# Patient Record
Sex: Female | Born: 1950 | Race: Black or African American | Hispanic: No | Marital: Single
Health system: Southern US, Community
[De-identification: ages and names within clinical notes are randomized; demographics above are authoritative.]

---

## 2007-03-05 ENCOUNTER — Ambulatory Visit: Payer: Self-pay | Admitting: Hospitalist

## 2007-03-05 ENCOUNTER — Inpatient Hospital Stay (HOSPITAL_COMMUNITY): Admission: EM | Admit: 2007-03-05 | Discharge: 2007-03-09 | Payer: Self-pay | Admitting: Emergency Medicine

## 2007-03-06 ENCOUNTER — Encounter (INDEPENDENT_AMBULATORY_CARE_PROVIDER_SITE_OTHER): Payer: Self-pay | Admitting: Hospitalist

## 2007-03-06 ENCOUNTER — Ambulatory Visit: Payer: Self-pay | Admitting: Vascular Surgery

## 2007-03-07 ENCOUNTER — Ambulatory Visit: Payer: Self-pay | Admitting: Physical Medicine & Rehabilitation

## 2007-03-09 ENCOUNTER — Ambulatory Visit: Payer: Self-pay | Admitting: Physical Medicine & Rehabilitation

## 2007-03-09 ENCOUNTER — Inpatient Hospital Stay (HOSPITAL_COMMUNITY)
Admission: RE | Admit: 2007-03-09 | Discharge: 2007-03-21 | Payer: Self-pay | Admitting: Physical Medicine & Rehabilitation

## 2007-04-04 ENCOUNTER — Encounter: Payer: Self-pay | Admitting: Licensed Clinical Social Worker

## 2007-04-04 ENCOUNTER — Ambulatory Visit: Payer: Self-pay | Admitting: Internal Medicine

## 2007-04-04 ENCOUNTER — Encounter (INDEPENDENT_AMBULATORY_CARE_PROVIDER_SITE_OTHER): Payer: Self-pay | Admitting: *Deleted

## 2007-04-04 DIAGNOSIS — F1010 Alcohol abuse, uncomplicated: Secondary | ICD-10-CM | POA: Insufficient documentation

## 2007-04-04 DIAGNOSIS — I10 Essential (primary) hypertension: Secondary | ICD-10-CM | POA: Insufficient documentation

## 2007-04-04 DIAGNOSIS — G8929 Other chronic pain: Secondary | ICD-10-CM

## 2007-04-10 LAB — CONVERTED CEMR LAB
CO2: 24 meq/L (ref 19–32)
Calcium: 9.7 mg/dL (ref 8.4–10.5)
Creatinine, Ser: 0.73 mg/dL (ref 0.40–1.20)
Glucose, Bld: 93 mg/dL (ref 70–99)
Sodium: 142 meq/L (ref 135–145)

## 2007-04-23 ENCOUNTER — Encounter
Admission: RE | Admit: 2007-04-23 | Discharge: 2007-04-23 | Payer: Self-pay | Admitting: Physical Medicine & Rehabilitation

## 2009-01-14 IMAGING — CT CT HEAD W/O CM
1 of 2 series · 13 of 30 positions shown, 17 images · IV contrast (agent unspecified)
Comparison: None.

CLINICAL DATA: Right-sided weakness. 
 HEAD CT WITHOUT CONTRAST:
TECHNIQUE: Contiguous axial images were obtained from the base of the skull through the vertex according to standard protocol without contrast.

[Series 2: brain · axial · 0.47mm/px · z∈[+142,+262]mm · 13 of 28 slices shown, 17 images]
[im 2/28  brain]
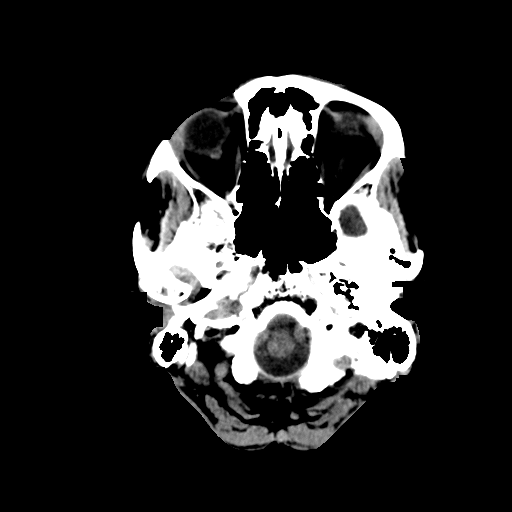
[im 2/28  bone]
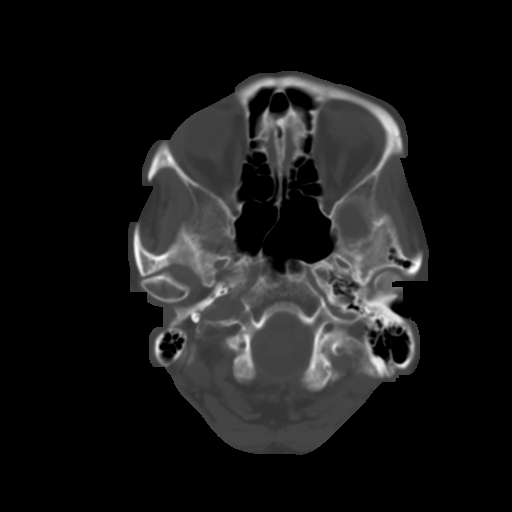
[im 4/28  brain]
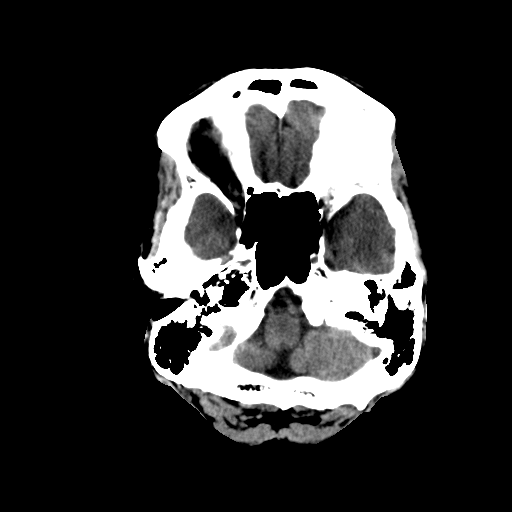
[im 6/28  brain]
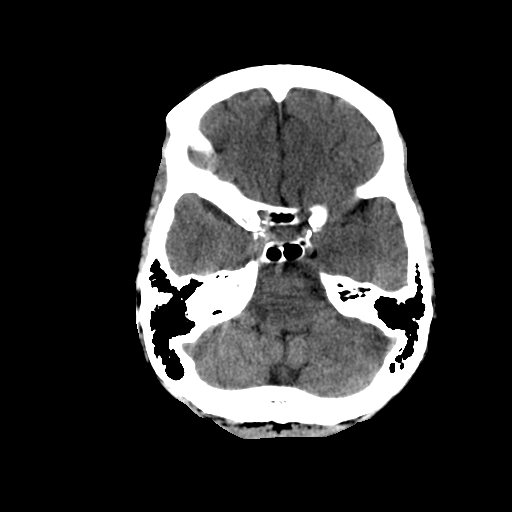
[im 8/28  brain]
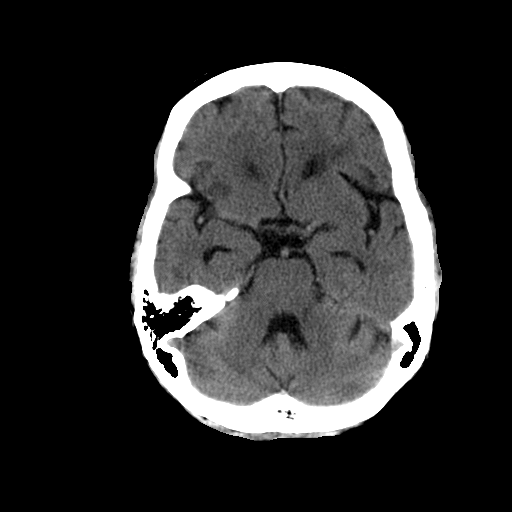
[im 10/28  brain]
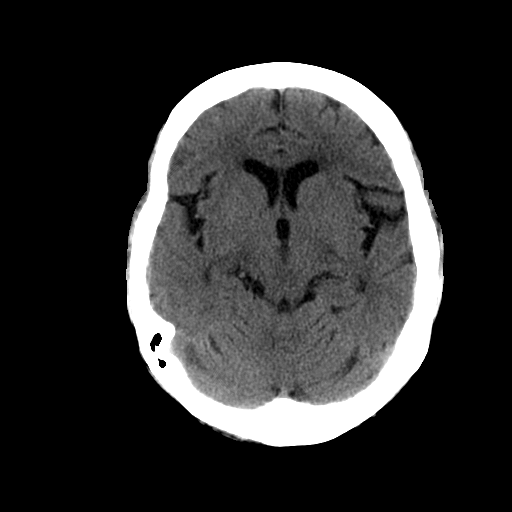
[im 10/28  bone]
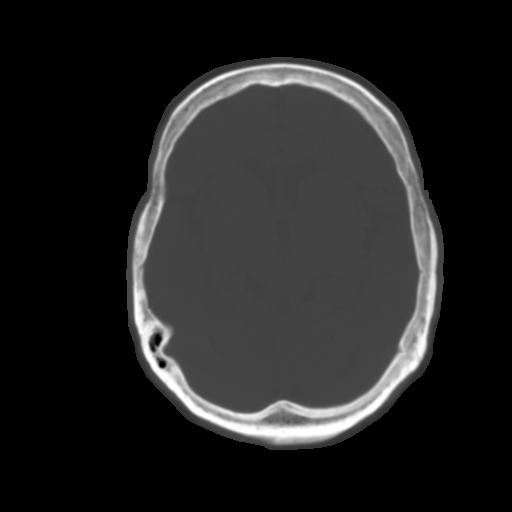
[im 12/28  brain]
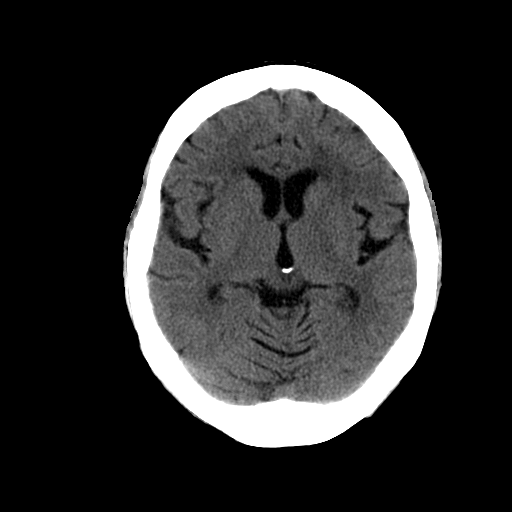
[im 14/28  brain]
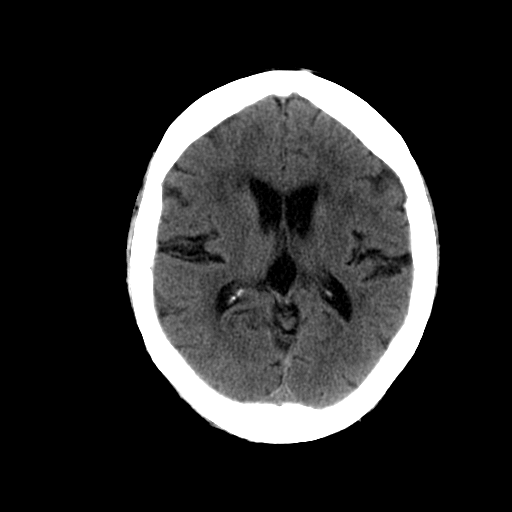
[im 16/28  brain]
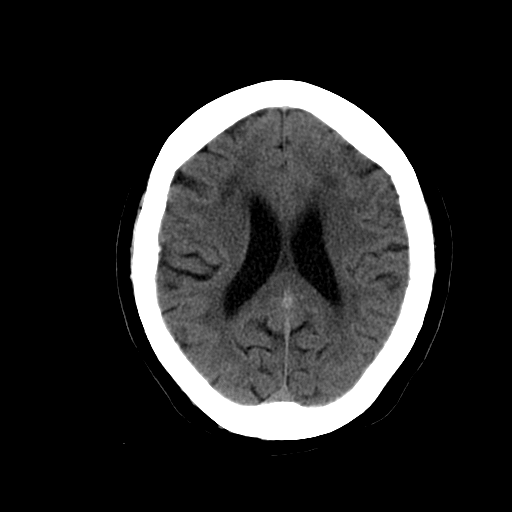
[im 18/28  brain]
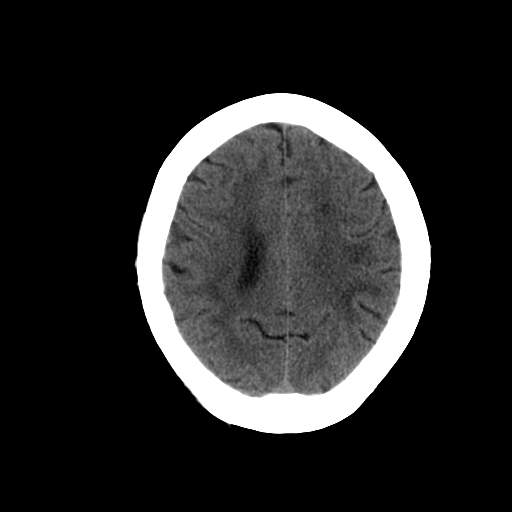
[im 18/28  bone]
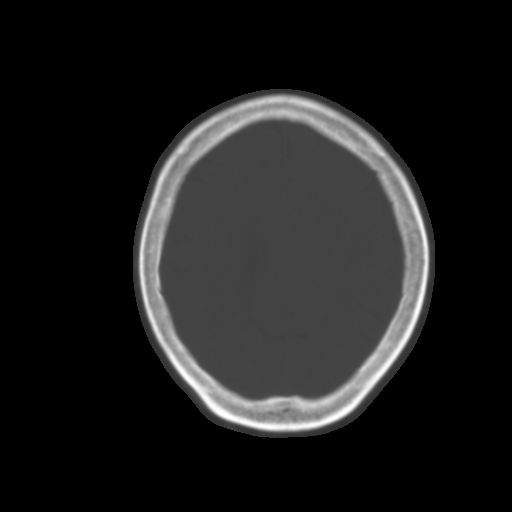
[im 20/28  brain]
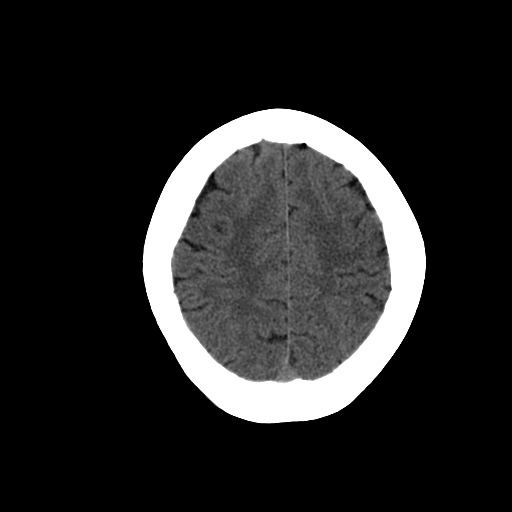
[im 22/28  brain]
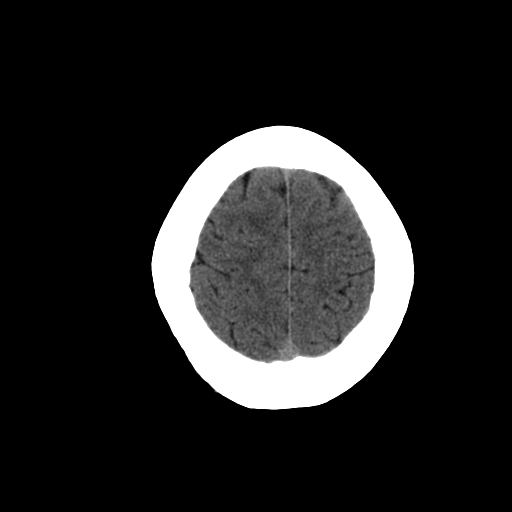
[im 24/28  brain]
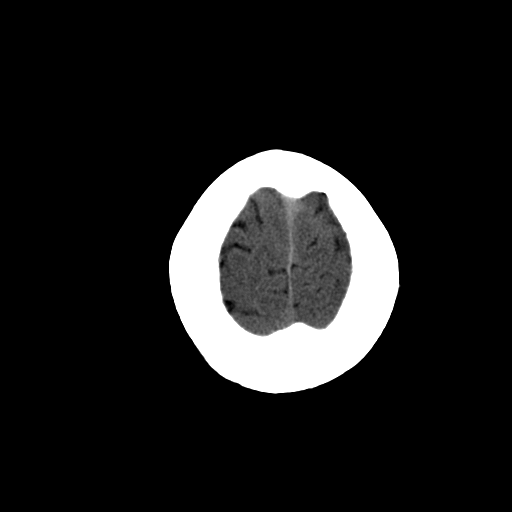
[im 26/28  brain]
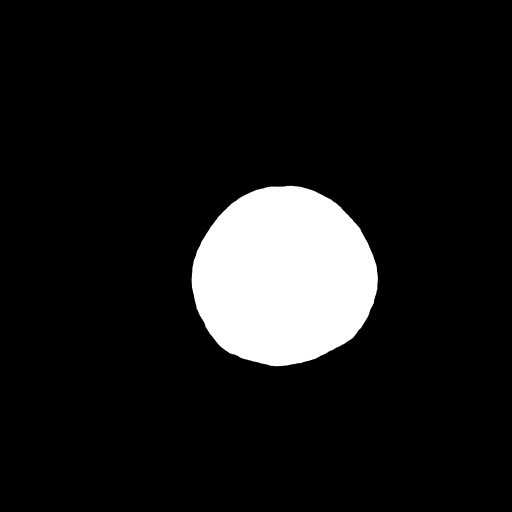
[im 26/28  bone]
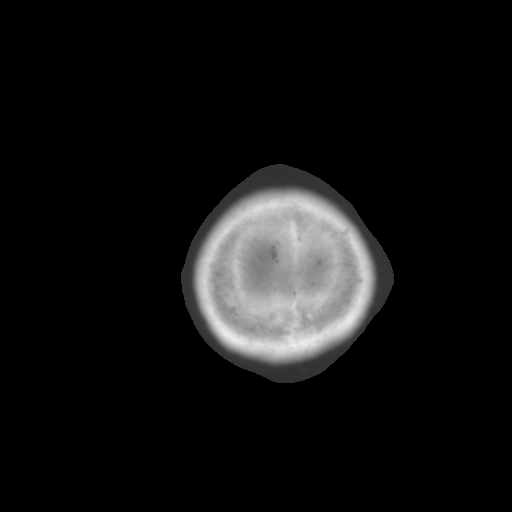

[13 of 30 positions shown; findings below may reference images not displayed]

FINDINGS: There is microvascular white matter disease.  There is no definitive acute infarct.  There is, however, a probable infarct in the posterior limb of the left internal capsule.  No hemorrhage. No mass effect. 
 Note that acute ischemic events may be occult on CT in the 1st 12-24 hours.  This scan should be repeated, or MRI should be done, if symptoms continue to evolve.
IMPRESSION: 1.  Chronic microvascular white matter disease.  
 2.  Probable infarct in the posterior limb of the left internal capsule. 
 3.  No definite acute infarct or bleed.  See note above.

## 2009-01-16 IMAGING — CR DG SHOULDER 2+V*R*
3 series · 3 of 3 positions shown · non-contrast
Comparison: none

CLINICAL DATA: Fell with proximal humeral pain. 
 RIGHT SHOULDER - 3 VIEW:

[t shoulder ap internal righ]
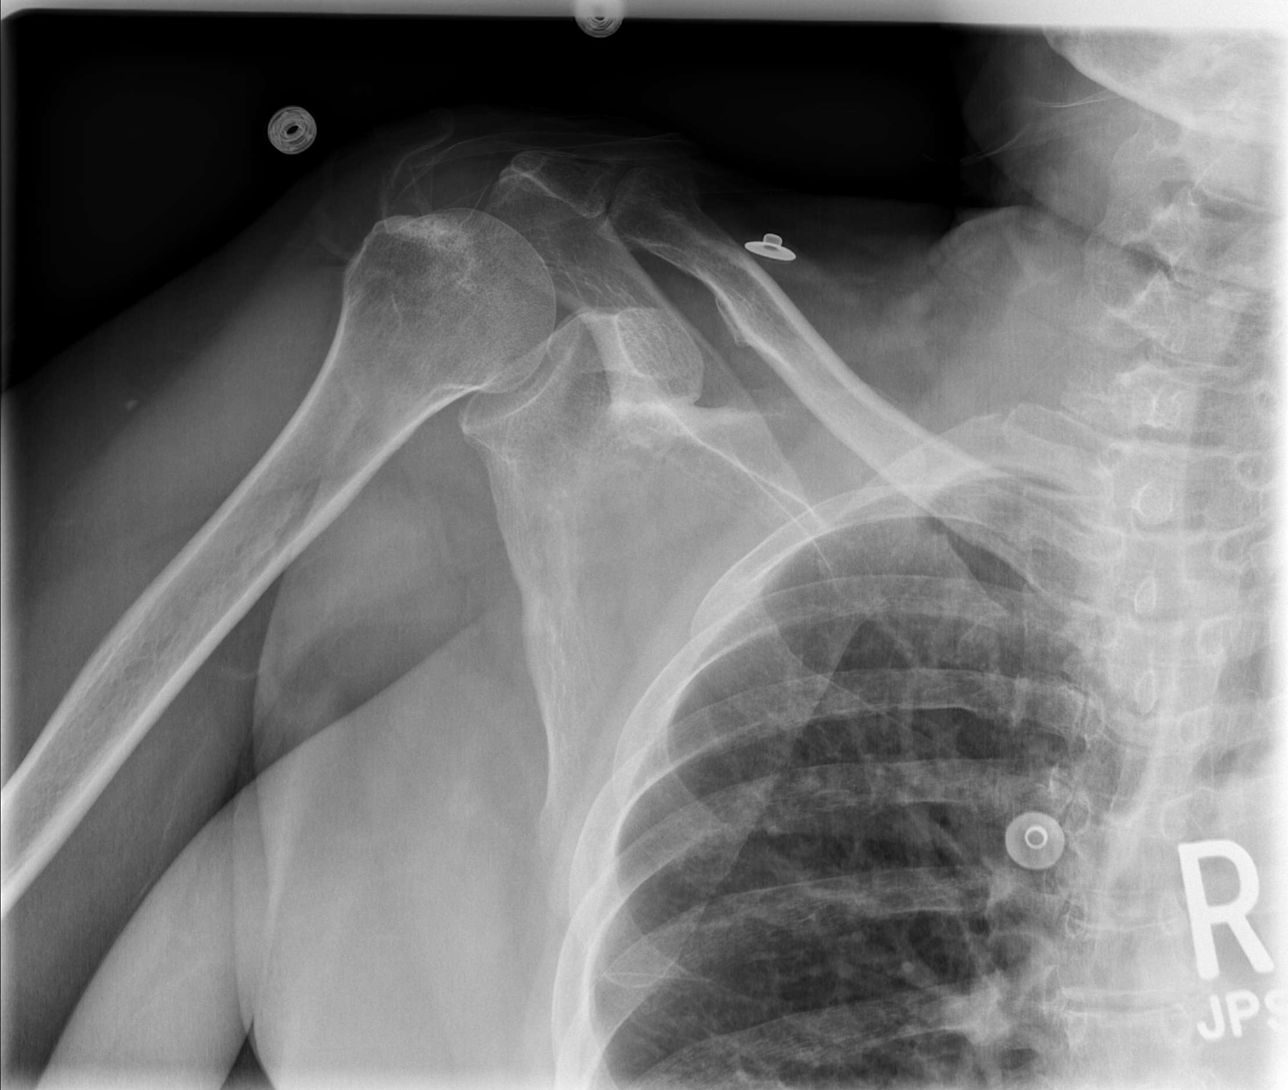

[t shoulder ap external righ]
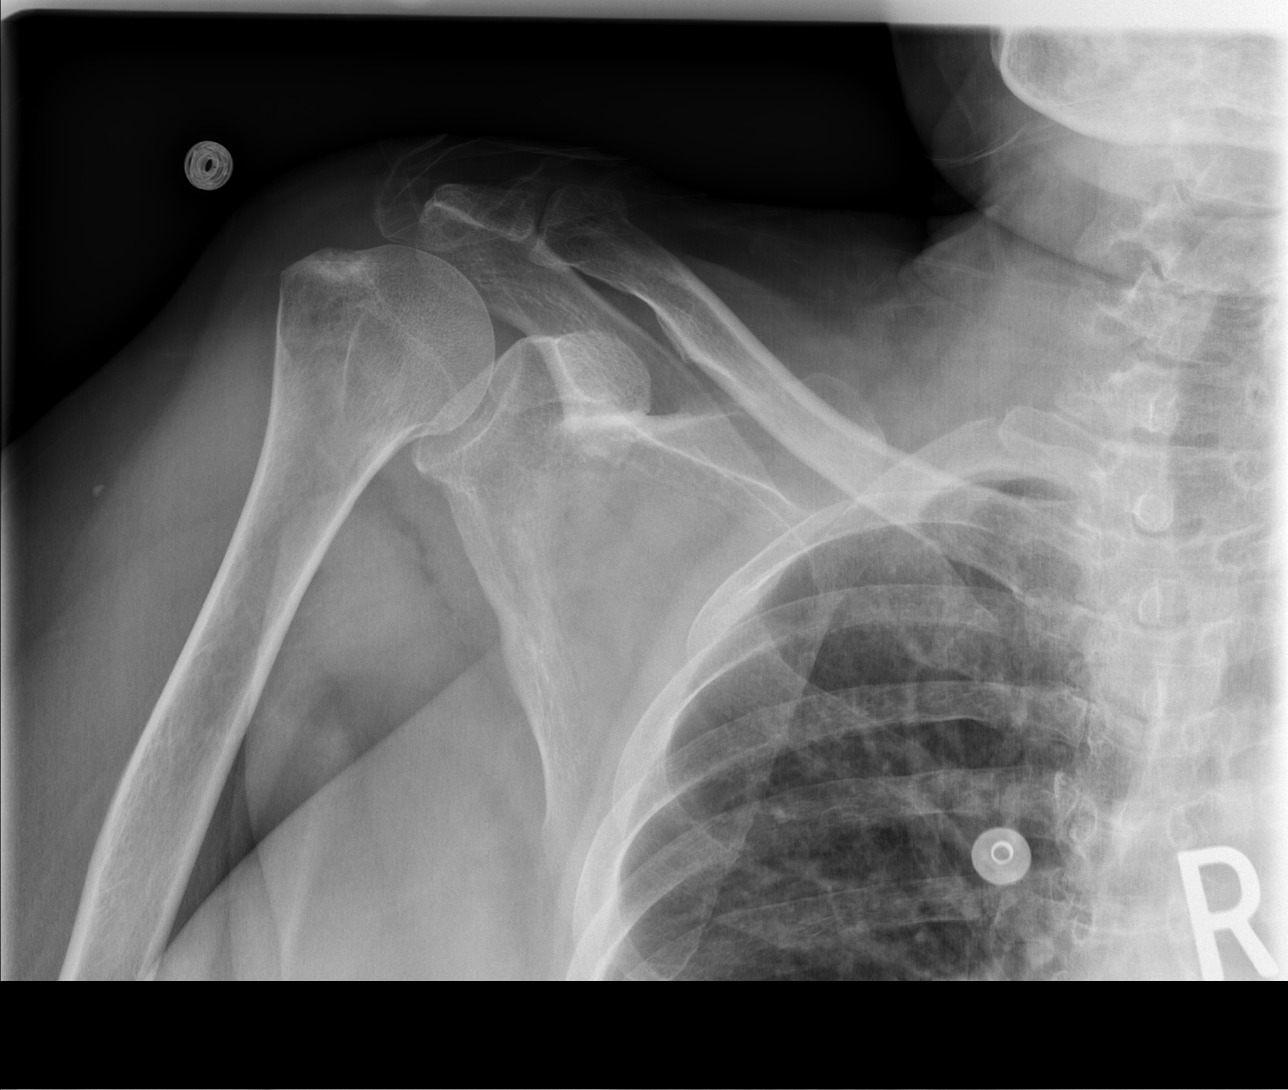

[t shoulder y view right]
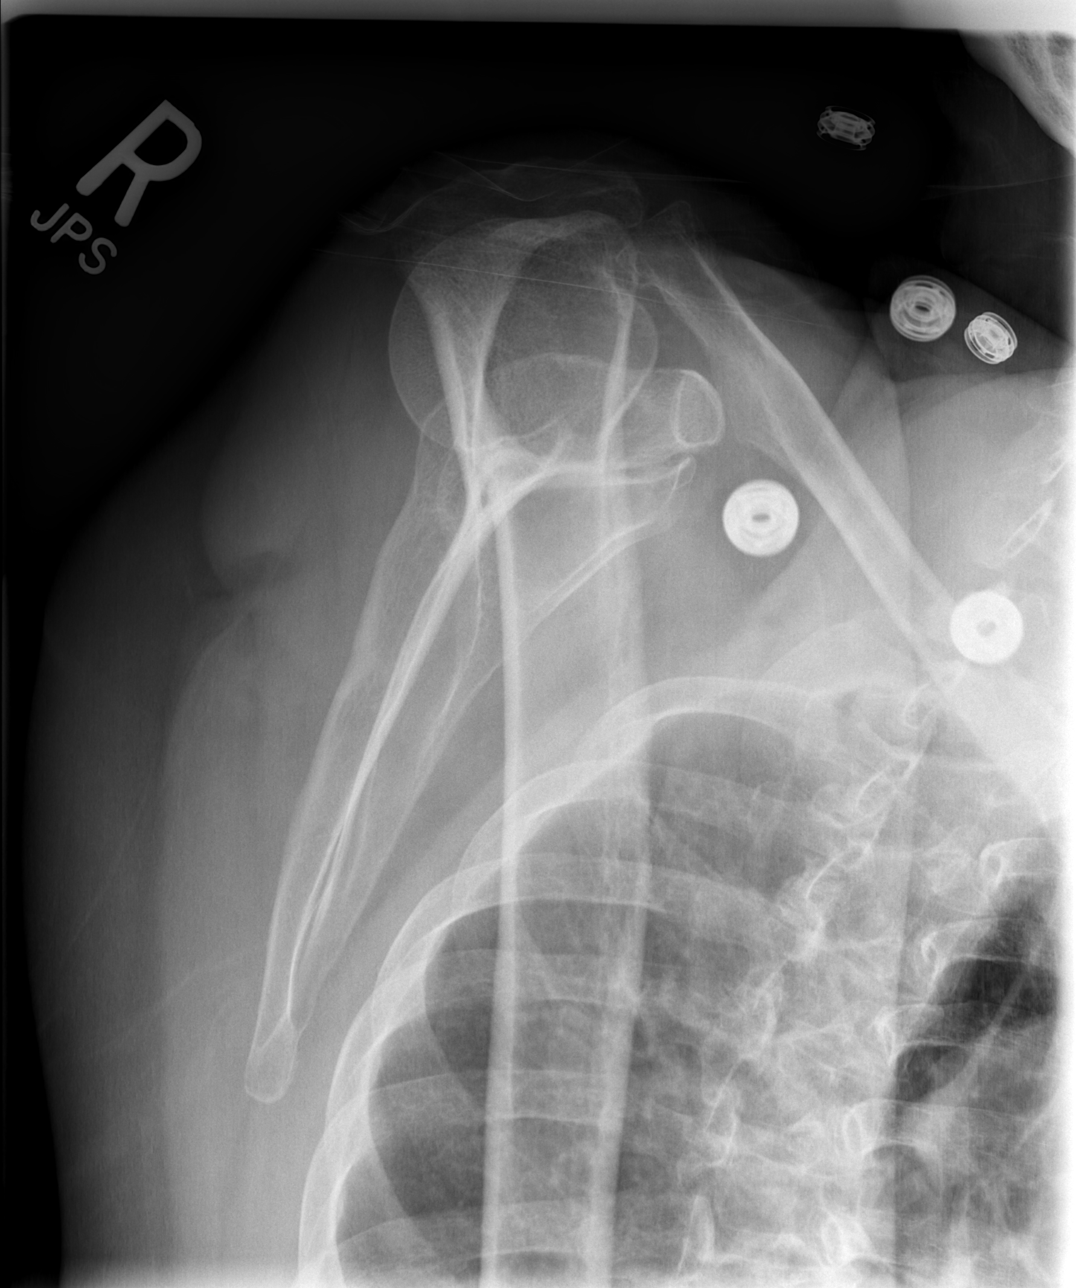

[3 of 3 positions shown; findings below may reference images not displayed]

FINDINGS: Three views of the right humerus were obtained. No acute fracture is seen.  The glenohumeral joint space appears normal. The AC joint is normally aligned.
IMPRESSION: Negative.

## 2010-07-01 ENCOUNTER — Ambulatory Visit: Payer: Self-pay | Admitting: Internal Medicine

## 2010-11-30 NOTE — H&P (Signed)
NAME:  Loretta Barajas, Loretta Barajas NO.:  [id number]   MEDICAL RECORD NO.:  [id number]          PATIENT TYPE:  IPS   LOCATION:  4009                         FACILITY:  [facility]   PHYSICIAN:  Ellwood Dense, M.D.   DATE OF BIRTH:  Feb 21, 1951   DATE OF ADMISSION:  03/09/2007  DATE OF DISCHARGE:                              HISTORY & PHYSICAL   PRIMARY CARE PHYSICIAN:  None.   HISTORY OF PRESENT ILLNESS:  Loretta Barajas is a 60 year old African-  American female with history of hypertension for which she took  hydrochlorothiazide at home.  The patient was admitted March 05, 2007,  with complaints of dizziness and right-sided weakness.  MRI studies  showed an acute ischemic infarct in the posterior limb of the left  internal capsule with extension into the lateral aspect of the left  thalamic region.  MRA study was suggestive of stenosis of the right  vertebrobasilar junction proximally and distally to the right pica where  there was narrowing of approximately 50%.  Echocardiogram showed an  ejection fraction of 55% to 65%.  Carotid Dopplers were negative for  internal carotid artery stenosis.  The patient was placed on aspirin for  stroke prophylaxis along with subcu Lovenox.  Her blood pressure control  was maintained on hydrochlorothiazide.   The patient was evaluated by the rehabilitation physicians and felt to  be an appropriate candidate for inpatient rehabilitation.   REVIEW OF SYSTEMS:  Noncontributory.   PAST MEDICAL HISTORY:  1. Hypertension.  2. Chronic low back pain.   FAMILY HISTORY:  Noncontributory.   SOCIAL HISTORY:  The patient lives with her daughter and son-in-law and  their 3 children in a home.  The patient's daughter has 6 other children  who are apparently outside the home.  The patient herself is on  Disability since 1993.  The daughter and son-in-law whom she lives with  work different shifts, according to the patient, but then she reports  that her  daughter may have recently lost her job.  The patient does not  use tobacco but admits to drinking 6-7 beers per day.  The patient's  daughter can assist as needed.  The home is a 1-level home with 1 step  to enter.  The grandchildren can also reportedly help the patient at  home.   FUNCTIONAL HISTORY PRIOR TO ADMISSION:  Independent but not driving.   ALLERGIES:  No known drug allergies.   MEDICATIONS PRIOR TO ADMISSION:  Hydrochlorothiazide 25 mg daily.   LABORATORY DATA:  Recent hemoglobin was 13.6 with hematocrit of 40.2,  platelet count of 263,000 and white count of 7.  Recent sodium was 142,  potassium 3.9, chloride 104, CO2 30, BUN 15 and creatinine 0.7.  Urine  drug screen was negative on admission.   PHYSICAL EXAMINATION:  GENERAL:  Reasonably well-appearing middle-aged  adult female lying in bed in no acute discomfort.  VITAL SIGNS:  Blood pressure 130/84 with pulse of 80, respiratory rate  20 and temperature 98.  HEENT:  Normocephalic, atraumatic.  CARDIOVASCULAR:  Regular rate and rhythm.  S1 and S2 without murmurs.  ABDOMEN:  Soft, nontender with positive bowel sounds.  NEUROLOGIC:  Alert and oriented x3 with occasional cues.  Cranial nerve  exam showed tongue deviating to the right with protrusion.  Facial  symmetry was intact and there was no reported sensation loss.  Extraocular muscles were intact with no reports of vision changes.  EXTREMITIES:  Left upper and left lower extremity exam showed 5-/5  strength throughout.  Bulk and tone were normal.  Reflexes were 2+ and  symmetrical.  Right upper extremity exam showed 2+/5 strength in  proximal right upper extremity and 0/5 in elbow flexion, elbow  extension, wrist extension and intrinsics of the right hand.  The right  lower extremity exam showed hip flexion at 3-/5 with knee extension of  3/5.  Sensation was intact to light touch throughout the bilateral lower  extremities.  Ankle dorsiflexion was 2/5.    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   Presently the patient has deficits in ADLs, transfers and ambulation  along with higher level cognition related to the above-noted left  internal capsule infarct.   PLAN:  1. Admit to the rehabilitation unit for daily therapies to include      physical therapy for range of motion, strengthening, bed mobility,      transfers, pre-gait training, gait training and equipment.  2. Occupational Therapy for range of motion, strengthening, ADLs,      cognitive/perceptual training, splinting and equipment eval.  3. Rehab nursing for skin care, wound care and bowel and bladder      training as necessary.  4. Speech Therapy for higher level cognition along with evaluation of      swallow as necessary.  5. Case management to assess home environment, assist with discharge      planning and arrange for appropriate followup care.  6. Social worker to assess family and social support and assist in      discharge planning.  7. Subcu Lovenox 40 mg daily for DVT prophylaxis.  8. CBC with diff and CMET in a.m. March 12, 2007.  9. Protonix 40 mg p.o. daily.  10.Folic acid 1 mg p.o. daily.  11.Thiamine 100 mcg p.o. daily.  12.Aspirin 81 mg p.o. daily.  13.Hydrochlorothiazide 25 mg p.o. daily.  14.Old EKG to chart.  15.Routine turning to prevent skin breakdown.  16.Dulcolax suppository 1 per rectum daily p.r.n.  17.Regular diet.  18.Tylenol 325 mg 1-2 tablets p.o. q.4 h p.r.n. for pain relief.   PROGNOSIS:  Good.   ESTIMATED LENGTH OF STAY:  Eight to 15 days.   GOALS:  Modified independent to stand-by assist for upper extremity ADLs  and min assist for lower extremities ADLs with modified independent  wheelchair mobility and transfers and min assist for ambulation.           ______________________________  Ellwood Dense, M.D.     DC/MEDQ  D:  03/09/2007  T:  03/09/2007  Job:  [id number]

## 2010-11-30 NOTE — Discharge Summary (Signed)
NAME:  Loretta Barajas, Loretta Barajas NO.:  [id number]   MEDICAL RECORD NO.:  [id number]          PATIENT TYPE:  INP   LOCATION:  5152                         FACILITY:  [facility]   PHYSICIAN:  Tacey Ruiz, MD    DATE OF BIRTH:  1950-08-02   DATE OF ADMISSION:  03/05/2007  DATE OF DISCHARGE:  03/09/2007                               DISCHARGE SUMMARY   CHIEF COMPLAINT:  Right-sided weakness.   DISCHARGE DIAGNOSES:  1. Left internal capsule infarct.  2. Hypertension.  3. Alcohol abuse.   DISCHARGE MEDICATIONS:  1. Aspirin 81 mg daily.  2. Folic acid 1 mg daily.  3. Hydrochlorothiazide 25 mg daily.  4. Thiamin 100 mg daily.   DISPOSITION AND FOLLOW UP:  Patient did not have a primary care doctor  prior to her admission.  Patient recently from [place] to [place]  and does not have a primary care doctor in the area.  She is more than  welcome to follow up with one of the doctors here at the outpatient  clinic, [phone].  Patient can call and make an appointment at the time  of her discharge from inpatient rehab.  Patient was transferred from  inpatient status to the inpatient rehab facility here at [facility], where she will undergo rehab therapy after she underwent a  left internal capsule infarct and was left with right-sided hemiparesis.  While in the inpatient rehab, they can continue to monitor the patient's  blood pressure, but this has been under good control on her current home  regimen.   PROCEDURES:  1. CT head, March 05, 2007.  Impression - chronic microvascular white      matter disease, probable infarct posterior limb of left internal      capsule.  No definite acute infarct or bleed.  2. MRI brain, March 06, 2007.  Impression - acute ischemic infarct      noted in the region of the posterior limb of the left internal      capsule extending into the lateral aspect of the left thalamic      region.  Nonspecific white matter changes, likely  represent areas      of ischemic gliosis related to hypertension or diabetes.  3. MRA neck, March 06, 2007.  Impression - no gross evidence of      occlusion, stenosis, dissection or aneurysm.  4. Right shoulder x-ray, March 07, 2007.  Impression - no acute      fracture.  Joint space appears normal.  5. Transthoracic echocardiogram, March 06, 2007, which showed no      likely intracardiac source of emboli was apparent.  Normal left      ventricular systolic function and ejection fraction of      approximately 65%.  6. Transcarotid Dopplers, March 06, 2007, which showed bilateral      tortuous ICAs, but without significant stenosis bilaterally.      Vertebral artery flow was antegrade bilaterally.   CULTURES:  None.   CONSULTATIONS:  Inpatient rehabilitation March 06, 2007.   BRIEF HISTORY AND PHYSICAL:  Ms. Zupko is a 59 year old  African  American female with a past medical history significant for only  hypertension.  Patient was initially helping her daughter move 2 days  prior to admission when she got up out of the truck and felt dizzy.  Patient says she felt drunk.  At that time, she had full movement of all  of her extremities.  Patient reports that dizziness and blurry vision  lasted for approximately 20 minutes then went away completely.  Patient  said she felt normal after that.  That night, patient went to bed and  awoke approximately at 3 a.m. to get up and go to the bathroom.  Patient, however, could not get up secondary to her right leg not  moving.  She was also unable to move her right arm.  Patient went back  to sleep and hoped the numbness would go away.  That morning, it still  had not gone away.  Patient waited for her daughter to get home from  work around 3 p.m. and came into the hospital.  Patient has had no prior  episodes of numbness or weakness in her extremities.  Patient has a  history of CVAs in both her mother and grandmother.  Patient denies a   history of hyperlipidemia, diabetes, any illicit drug use or cocaine  use.  She does report current problems making her words, but has no  problem finding her words.  Patient denies any vision changes, any  current numbness or any dizziness.  Patient has been out on disability  since 1993 secondary to unknown reasons.  The only medication patient  was on prior to admission was hydrochlorothiazide.  She only recently  moved to [facility] within the past month and was living with her  daughter.   REVIEW OF SYMPTOMS:  Negative.   PHYSICAL EXAMINATION:  VITAL SIGNS:  Temperature 98.4, blood pressure  124/91, pulse 108, respiratory rate 18, saturations 99% on room air.  GENERAL:  In no acute distress.  Alert and oriented x3.  Right  hemiparesis.  HEENT:  Pupils equal, reactive to light and accommodation.  Extraocular  movements intact.  Anicteric.  Mucous membranes moist.  Poor dentition.  NECK:  Supple.  No LAD.  No bruit.  RESPIRATORY:  Clear to auscultation bilaterally.  CARDIOVASCULAR:  Regular rate and rhythm.  No murmurs, rubs or gallops.  GI:  Soft, nontender, no distention.  Positive bowel sounds.  Obese.  EXTREMITIES:  No clubbing, cyanosis or edema.  Good peripheral pulses.  SKIN:  No rashes.  NEURO:  Alert and oriented x4.  Cranial nerves II-XII intact, but with a  slight right-sided facial droop at the mouth.  Complete right upper  extremity and right lower extremity paralysis.  Patient has sensation  intact bilaterally upper and lower extremities to fine touch.  DTRs were  2+ and symmetric.  Patient had upgoing Babinski sign on the right.   LABORATORY DATA:  White count 7.6, hemoglobin 13.7, platelets 262.  Patient had a PT of 13.5, PTT 26, INR 1.0.  Patient had a CMET with  sodium 141, potassium 3.7, chloride 105, bicarb 28, BUN 12, creatinine  0.86, and glucose 92.  AST 20, ALT 12, protein 7.2, albumin 3.6, calcium  9.1.  CT of her head showed a left internal capsule  infarct.   HOSPITAL COURSE:  1. Right-sided hemiparesis.  Patient presented with right-sided      hemiparesis approximately 17 hours after her initial symptom, which      was dizziness.  Therefore, she was not a candidate for TPA.      Patient had a CT of the head done, which showed a left internal      capsule infarct of indeterminate age.  A MRI was repeated which      showed an acute left internal capsule infarct that extended into      the thalamic region.  Since TPA was not an option, there was no      medical treatment we could do for this patient, other than observe.      We also worked the patient up to try to identify risk factors that      would lead to a cardiovascular event.  Patient had a transthoracic      echocardiogram which was normal.  Patient had a hemoglobin A1c,      which was normal at 5.6.  Patient had a fasting lipid profile,      which was also normal with a total cholesterol of 167 and a LDL of      97.  Patient had a TSH level checked, which was normal at 2.783.      Patient had a urine drug screen, which was negative for cocaine.      Patient had normal transcarotid Dopplers.  Therefore, the only risk      factor we could identify in this patient was a history of      hypertension and a family history of cerebrovascular events.      Patient's hemiparesis improved slightly over the course of her      hospitalization.  Initially, patient had no movement at all in her      right upper extremity and right lower extremity.  However, patient      began to get some movement back in her shoulder and slight movement      in her right elbow.  Patient was also able to move her right hip      and lift her leg off the bed at the time of discharge.  Patient was      discharged to inpatient rehab with hopes that intensive      rehabilitation therapy will improve her mobility.  2. Hypertension.  Patient has a history of hypertension and when      admitted to the hospital,  stated she was on hydrochlorothiazide at      unknown dose.  Patient did not have a PCP in [place] and said      that she had only started seeing a doctor in the past month, who      had given her a prescription for this medication.  Patient reports      to seeing multiple doctors in the past, but having no continuity      with any single doctor.  Her systolic pressure in the ED was 124.      We initially started the patient back on her blood pressure, but we      were advised to hold the blood pressure medicine 72 hours post      ischemic event.  We then held the hydrochlorothiazide and restarted      it 72 hours later.  Patient's blood pressure during her      hospitalization got up to as high 190, but after      hydrochlorothiazide was restarted, systolic blood pressure remained      in the 130-140s.  3. Patient has a history of alcohol abuse and  was started on thiamin      and folic acid.  Patient was also started on CIWA protocol, but did      not require any Ativan for withdrawal symptoms.  Patient      acknowledges using alcohol on both weekend days every week and      drinks greater than five 40s on each day.  4. Right shoulder pain.  On the second day of hospitalization, patient      acknowledged having right shoulder pain.  Patient then stated that      she had a fall in her bathroom while trying to ambulate one day      prior to admission.  Secondary to this fall, we got a right      shoulder x-ray which was negative for any kind of fracture.  She      had a normal joint space.  It was felt likely that this shoulder      pain was secondary to overuse and as this joint was the only one      which the patient could actively move secondary to her right-sided      paralysis.  The shoulder pain improved over the course of      hospitalization.  Patient did not require any pain medications.  5. Hypokalemia.  On March 07, 2007, patient's potassium dropped to      3.4.  This was  repleted orally.  This problem then resolved.  A      magnesium level was also checked and was low at 1.6.  6. Hypomagnesemia.  Patient's magnesium level was checked secondary to      hypokalemia and was lot at 1.6.  This was repleted orally with p.o.      Mag-Ox and resolved.   DISCHARGE LABS AND VITALS:  Temperature 98.3, pulse 80, respiratory rate  20, systolic blood pressure 131/98, saturations 99% on room air.  CBC -  white count 7.1, hemoglobin 13.6, platelets 263.  BMET - sodium 142,  potassium 3.9, chloride 104, bicarb 30, BUN 15, creatinine 0.79, glucose  77.      Tacey Ruiz, MD  Electronically Signed     JP/MEDQ  D:  03/09/2007  T:  03/10/2007  Job:  [id number]   cc:   Ranelle Oyster, M.D.

## 2010-11-30 NOTE — Discharge Summary (Signed)
NAME:  Loretta Barajas, BRACH NO.:  [id number]   MEDICAL RECORD NO.:  [id number]          PATIENT TYPE:  IPS   LOCATION:  4009                         FACILITY:  [facility]   PHYSICIAN:  Ranelle Oyster, M.D.DATE OF BIRTH:  November 05, 1950   DATE OF ADMISSION:  03/09/2007  DATE OF DISCHARGE:  03/21/2007                               DISCHARGE SUMMARY   DISCHARGE DIAGNOSES:  1. Left cerebrovascular accident.  2. Subcutaneous Lovenox for deep vein thrombosis prophylaxis.  3. Hypertension.  4. History of alcohol abuse.   HISTORY OF PRESENT ILLNESS:  This is a 60 year old African American  female with A history of uncontrolled hypertension who was admitted  March 05, 2007 with dizziness and right-sided weakness.  MRI showed  acute ischemic infarction posterior limb of left internal capsule and  extending into the lateral aspect of the left thalamic region.  MRA with  suggestion of stenosis right vertebrobasilar junction proximally and  distal to the right PICA with narrowing of approximately 50%.  Echocardiogram with ejection fraction 55-65%.  Carotid Dopplers negative  for ICA stenosis.  Placed on aspirin as well as subcutaneous Lovenox.  Blood pressures monitored with hydrochlorothiazide.   PAST MEDICAL HISTORY:  1. Hypertension.  2. Chronic low back pain.  3. She drinks approximately six to seven beers a day.  4. Denies tobacco use.   SOCIAL HISTORY:  Lives with her daughter and son-in-law and three  children.  The patient on disability since 1993.  Daughter and son-in-  law work different shifts.   MEDICATIONS PRIOR TO ADMISSION:  Hydrochlorothiazide 25 mg daily.   ALLERGIES:  None.   REHABILITATION HOSPITAL COURSE:  The patient was admitted to inpatient  rehab services with therapies initiated on a 3-hour daily basis  consisting of physical therapy, occupational therapy and rehabilitation  nursing.  The following issues were addressed during the patient's  rehabilitation stay.  Pertaining to Ms. Jurczyk's left cerebrovascular  accident, she remained on aspirin therapy for CVA prophylaxis.  She was  minimal assist overall for transfers and ambulation.  She was fitted  with a right AFO brace per [facility].  Blood pressures  monitored with hydrochlorothiazide 25 mg day.  Lopressor was added to  her regimen and titrated to 50 mg twice daily.  Diastolic pressure still  around 88.  She denied any dizziness or headache.  She remained on  subcutaneous Lovenox throughout her rehab course for deep vein  thrombosis prophylaxis.  She did have a history of alcohol use.  There  were no signs of withdrawal during her rehabilitation stay.  She  received full counseling on the need to abstain from any alcohol or  tobacco products.  It was questionable if she would be compliant with  these requests.  Overall her strength and endurance greatly improved  during her rehabilitation stay.  She was discharged to home with family.   Latest labs showed a sodium 138, potassium 3.8, BUN 18, creatinine 0.6,  hemoglobin 13.8, hematocrit 40.2, platelet 260,000.   DISCHARGE MEDICATIONS:  1. Aspirin 81 mg daily.  2. Folic acid 1 mg daily.  3. Hydrochlorothiazide  25 mg daily.  4. Potassium chloride 20 mEq daily.  5. Lopressor 50 mg twice daily.  6. Neurontin 300 mg 3 times daily.   DIET:  Regular.   DISCHARGE INSTRUCTIONS:  She was advised no smoking, no drinking, no  alcohol.  She did not have a primary M.D.  Case management was involved  with attempts to locate primary M.D. hopefully with teaching service  here at [facility] who had seen the patient during her hospitalization  stay.  She was to see Dr. Riley Kill back at the outpatient rehab service  office on April 24, 2007.      Mariam Dollar, P.A.      Ranelle Oyster, M.D.  Electronically Signed    DA/MEDQ  D:  03/21/2007  T:  03/21/2007  Job:  [id number]   cc:   Ranelle Oyster, M.D.   Tacey Ruiz, MD

## 2011-04-29 LAB — CBC
HCT: 40.2
HCT: 40.4
Hemoglobin: 13.3
MCHC: 33.6
MCHC: 33.6
MCHC: 34.1
MCV: 80.5
MCV: 80.9
MCV: 81.1
MCV: 82.1
Platelets: 250
Platelets: 252
Platelets: 258
Platelets: 262
Platelets: 263
RBC: 4.99
RBC: 4.99
RBC: 4.99
RDW: 14.6 — ABNORMAL HIGH
RDW: 14.9 — ABNORMAL HIGH
WBC: 6.2
WBC: 7.1
WBC: 7.2
WBC: 7.6
WBC: 8.9

## 2011-04-29 LAB — RAPID URINE DRUG SCREEN, HOSP PERFORMED
Barbiturates: NOT DETECTED
Benzodiazepines: NOT DETECTED
Cocaine: NOT DETECTED
Opiates: NOT DETECTED

## 2011-04-29 LAB — COMPREHENSIVE METABOLIC PANEL
ALT: 12
AST: 20
AST: 29
Albumin: 3.6
Albumin: 3.6
Alkaline Phosphatase: 44
Alkaline Phosphatase: 49
BUN: 12
CO2: 24
CO2: 28
CO2: 28
Calcium: 9.2
Chloride: 101
Chloride: 105
Chloride: 105
Creatinine, Ser: 0.51
Creatinine, Ser: 0.77
GFR calc Af Amer: >60
GFR calc Af Amer: >60
GFR calc non Af Amer: >60
GFR calc non Af Amer: >60
GFR calc non Af Amer: >60
Potassium: 3.3 — ABNORMAL LOW
Potassium: 3.7
Sodium: 141
Total Bilirubin: 0.5
Total Bilirubin: 0.6
Total Bilirubin: 1

## 2011-04-29 LAB — BASIC METABOLIC PANEL
BUN: 18
BUN: 15
BUN: 9
CO2: 27
Calcium: 9.5
Calcium: 8.8
Calcium: 9.2
Calcium: 9.7
Chloride: 101
Creatinine, Ser: 0.62
Creatinine, Ser: 0.66
Creatinine, Ser: 0.79
GFR calc Af Amer: >60
GFR calc non Af Amer: >60
GFR calc non Af Amer: >60
GFR calc non Af Amer: >60
Glucose, Bld: 86
Glucose, Bld: 87
Potassium: 3.9
Sodium: 140
Sodium: 141

## 2011-04-29 LAB — HEMOGLOBIN A1C
Hgb A1c MFr Bld: 5.6
Mean Plasma Glucose: 122

## 2011-04-29 LAB — URINE MICROSCOPIC-ADD ON

## 2011-04-29 LAB — URINALYSIS, ROUTINE W REFLEX MICROSCOPIC
Nitrite: NEGATIVE
Specific Gravity, Urine: 1.022
Urobilinogen, UA: 0.2
pH: 5.5

## 2011-04-29 LAB — LIPID PANEL
Cholesterol: 167
HDL: 62
LDL Cholesterol: 97
Triglycerides: 41

## 2011-04-29 LAB — DIFFERENTIAL
Basophils Absolute: 0.1
Basophils Absolute: 0.1
Basophils Relative: 1
Eosinophils Absolute: 0.1
Eosinophils Absolute: 0.2
Eosinophils Relative: 2
Eosinophils Relative: 3
Lymphocytes Relative: 45
Lymphocytes Relative: 46
Monocytes Absolute: 0.6
Monocytes Absolute: 0.7

## 2011-04-29 LAB — CARDIAC PANEL(CRET KIN+CKTOT+MB+TROPI)
Relative Index: 2.4
Total CK: 129
Troponin I: 0.02

## 2011-04-29 LAB — URINE CULTURE

## 2011-04-29 LAB — TROPONIN I: Troponin I: 0.01

## 2011-04-29 LAB — CK TOTAL AND CKMB (NOT AT ARMC): Total CK: 129

## 2011-04-29 LAB — MAGNESIUM: Magnesium: 1.6

## 2011-04-29 LAB — PROTIME-INR: Prothrombin Time: 13.5

## 2016-04-17 DEATH — deceased

## 2022-12-10 NOTE — Progress Notes (Signed)
none
